# Patient Record
Sex: Male | Born: 1985 | Race: Black or African American | Hispanic: No | Marital: Single | Smoking: Never smoker
Health system: Southern US, Community
[De-identification: ages and names within clinical notes are randomized; demographics above are authoritative.]

---

## 2010-11-03 ENCOUNTER — Emergency Department (HOSPITAL_COMMUNITY)
Admission: EM | Admit: 2010-11-03 | Discharge: 2010-11-03 | Discharge status: Against Medical Advice | Payer: Self-pay | Attending: Emergency Medicine | Admitting: Emergency Medicine

## 2010-11-03 DIAGNOSIS — L298 Other pruritus: Secondary | ICD-10-CM | POA: Insufficient documentation

## 2010-11-03 DIAGNOSIS — L2989 Other pruritus: Secondary | ICD-10-CM | POA: Insufficient documentation

## 2010-11-03 DIAGNOSIS — L989 Disorder of the skin and subcutaneous tissue, unspecified: Secondary | ICD-10-CM | POA: Insufficient documentation

## 2010-11-10 ENCOUNTER — Emergency Department (HOSPITAL_COMMUNITY)
Admission: EM | Admit: 2010-11-10 | Discharge: 2010-11-10 | Disposition: A | Discharge status: Home / Self Care | Payer: Self-pay | Attending: Emergency Medicine | Admitting: Emergency Medicine

## 2010-11-10 DIAGNOSIS — L299 Pruritus, unspecified: Secondary | ICD-10-CM | POA: Insufficient documentation

## 2010-11-10 DIAGNOSIS — R21 Rash and other nonspecific skin eruption: Secondary | ICD-10-CM | POA: Insufficient documentation

## 2010-11-10 DIAGNOSIS — R229 Localized swelling, mass and lump, unspecified: Secondary | ICD-10-CM | POA: Insufficient documentation

## 2020-07-21 ENCOUNTER — Encounter (HOSPITAL_COMMUNITY): Payer: Self-pay | Admitting: Emergency Medicine

## 2020-07-21 DIAGNOSIS — S0181XA Laceration without foreign body of other part of head, initial encounter: Secondary | ICD-10-CM | POA: Diagnosis not present

## 2020-07-21 DIAGNOSIS — Y990 Civilian activity done for income or pay: Secondary | ICD-10-CM | POA: Diagnosis not present

## 2020-07-21 DIAGNOSIS — S63616A Unspecified sprain of right little finger, initial encounter: Secondary | ICD-10-CM | POA: Diagnosis not present

## 2020-07-21 DIAGNOSIS — S8991XA Unspecified injury of right lower leg, initial encounter: Secondary | ICD-10-CM | POA: Diagnosis present

## 2020-07-21 DIAGNOSIS — S8391XA Sprain of unspecified site of right knee, initial encounter: Secondary | ICD-10-CM | POA: Diagnosis not present
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
## 2020-07-22 ENCOUNTER — Emergency Department (HOSPITAL_COMMUNITY): Payer: No Typology Code available for payment source

## 2020-07-22 ENCOUNTER — Emergency Department (HOSPITAL_COMMUNITY)
Admission: EM | Admit: 2020-07-22 | Discharge: 2020-07-22 | Disposition: A | Discharge status: Home / Self Care | Payer: No Typology Code available for payment source | Attending: Emergency Medicine | Admitting: Emergency Medicine

## 2020-07-22 DIAGNOSIS — S8391XA Sprain of unspecified site of right knee, initial encounter: Secondary | ICD-10-CM

## 2020-07-22 DIAGNOSIS — S63616A Unspecified sprain of right little finger, initial encounter: Secondary | ICD-10-CM

## 2020-07-22 MED ORDER — TETANUS-DIPHTH-ACELL PERTUSSIS 5-2.5-18.5 LF-MCG/0.5 IM SUSY
0.5000 mL | PREFILLED_SYRINGE | Freq: Once | INTRAMUSCULAR | Status: DC
  Filled 2020-07-22: qty 0.5
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
